# Patient Record
Sex: Male | Born: 1970 | Race: Black or African American | Hispanic: No | Marital: Married | Smoking: Current some day smoker
Health system: Southern US, Community
[De-identification: ages and names within clinical notes are randomized; demographics above are authoritative.]

## PROBLEM LIST (undated history)

## (undated) DIAGNOSIS — M1990 Unspecified osteoarthritis, unspecified site: Secondary | ICD-10-CM

## (undated) HISTORY — DX: Unspecified osteoarthritis, unspecified site: M19.90

---

## 2013-03-25 ENCOUNTER — Emergency Department (HOSPITAL_COMMUNITY): Payer: Self-pay

## 2013-03-25 ENCOUNTER — Encounter (HOSPITAL_COMMUNITY): Payer: Self-pay | Admitting: Emergency Medicine

## 2013-03-25 ENCOUNTER — Emergency Department (HOSPITAL_COMMUNITY)
Admission: EM | Admit: 2013-03-25 | Discharge: 2013-03-25 | Disposition: A | Discharge status: Home / Self Care | Payer: Self-pay | Attending: Emergency Medicine | Admitting: Emergency Medicine

## 2013-03-25 DIAGNOSIS — R5381 Other malaise: Secondary | ICD-10-CM | POA: Insufficient documentation

## 2013-03-25 DIAGNOSIS — M5412 Radiculopathy, cervical region: Secondary | ICD-10-CM | POA: Insufficient documentation

## 2013-03-25 DIAGNOSIS — G8929 Other chronic pain: Secondary | ICD-10-CM | POA: Insufficient documentation

## 2013-03-25 DIAGNOSIS — R209 Unspecified disturbances of skin sensation: Secondary | ICD-10-CM | POA: Insufficient documentation

## 2013-03-25 DIAGNOSIS — F17200 Nicotine dependence, unspecified, uncomplicated: Secondary | ICD-10-CM | POA: Insufficient documentation

## 2013-03-25 DIAGNOSIS — R5383 Other fatigue: Secondary | ICD-10-CM

## 2013-03-25 MED ORDER — KETOROLAC TROMETHAMINE 60 MG/2ML IM SOLN
60.0000 mg | Freq: Once | INTRAMUSCULAR | Status: AC
  Administered 2013-03-25: 60 mg via INTRAMUSCULAR
  Filled 2013-03-25: qty 2

## 2013-03-25 MED ORDER — IBUPROFEN 600 MG PO TABS: 600.0000 mg | ORAL_TABLET | Freq: Four times a day (QID) | ORAL | Status: DC | PRN

## 2013-03-25 MED ORDER — PREDNISONE 20 MG PO TABS: ORAL_TABLET | ORAL | Status: DC

## 2013-03-25 NOTE — ED Notes (Signed)
Pt states back and neck pain starting x 1 year.  Pain is getting worse and pt states changes in feeling to hands.  Change in hands started Thursday, however numbness has been going off/on for a while.

## 2013-03-25 NOTE — Discharge Instructions (Signed)
Cervical Radiculopathy Cervical radiculopathy happens when a nerve in the neck is pinched or bruised by a slipped (herniated) disk or by arthritic changes in the bones of the cervical spine. This can occur due to an injury or as part of the normal aging process. Pressure on the cervical nerves can cause pain or numbness that runs from your neck all the way down into your arm and fingers. CAUSES  There are many possible causes, including:  Injury.  Muscle tightness in the neck from overuse.  Swollen, painful joints (arthritis).  Breakdown or degeneration in the bones and joints of the spine (spondylosis) due to aging.  Bone spurs that may develop near the cervical nerves. SYMPTOMS  Symptoms include pain, weakness, or numbness in the affected arm and hand. Pain can be severe or irritating. Symptoms may be worse when extending or turning the neck. DIAGNOSIS  Your caregiver will ask about your symptoms and do a physical exam. He or she may test your strength and reflexes. X-rays, CT scans, and MRI scans may be needed in cases of injury or if the symptoms do not go away after a period of time. Electromyography (EMG) or nerve conduction testing may be done to study how your nerves and muscles are working. TREATMENT  Your caregiver may recommend certain exercises to help relieve your symptoms. Cervical radiculopathy can, and often does, get better with time and treatment. If your problems continue, treatment options may include:  Wearing a soft collar for short periods of time.  Physical therapy to strengthen the neck muscles.  Medicines, such as nonsteroidal anti-inflammatory drugs (NSAIDs), oral corticosteroids, or spinal injections.  Surgery. Different types of surgery may be done depending on the cause of your problems. HOME CARE INSTRUCTIONS   Put ice on the affected area.  Put ice in a plastic bag.  Place a towel between your skin and the bag.  Leave the ice on for 15-20 minutes,  03-04 times a day or as directed by your caregiver.  If ice does not help, you can try using heat. Take a warm shower or bath, or use a hot water bottle as directed by your caregiver.  You may try a gentle neck and shoulder massage.  Use a flat pillow when you sleep.  Only take over-the-counter or prescription medicines for pain, discomfort, or fever as directed by your caregiver.  If physical therapy was prescribed, follow your caregiver's directions.  If a soft collar was prescribed, use it as directed. SEEK IMMEDIATE MEDICAL CARE IF:   Your pain gets much worse and cannot be controlled with medicines.  You have weakness or numbness in your hand, arm, face, or leg.  You have a high fever or a stiff, rigid neck.  You lose bowel or bladder control (incontinence).  You have trouble with walking, balance, or speaking. MAKE SURE YOU:   Understand these instructions.  Will watch your condition.  Will get help right away if you are not doing well or get worse. Document Released: 10/13/2000 Document Revised: 04/12/2011 Document Reviewed: 09/01/2010 [facility] Patient Information 2014 [place], [place].   Emergency Department Resource Guide 1) Find a Doctor and Pay Out of Pocket Although you won't have to find out who is covered by your insurance plan, it is a good idea to ask around and get recommendations. You will then need to call the office and see if the doctor you have chosen will accept you as a new patient and what types of options they offer for  patients who are self-pay. Some doctors offer discounts or will set up payment plans for their patients who do not have insurance, but you will need to ask so you aren't surprised when you get to your appointment.  2) Contact Your Local Health Department Not all health departments have doctors that can see patients for sick visits, but many do, so it is worth a call to see if yours does. If you don't know where your local health  department is, you can check in your phone book. The CDC also has a tool to help you locate your state's health department, and many state websites also have listings of all of their local health departments.  3) Find a Walk-in Clinic If your illness is not likely to be very severe or complicated, you may want to try a walk in clinic. These are popping up all over the country in pharmacies, drugstores, and shopping centers. They're usually staffed by nurse practitioners or physician assistants that have been trained to treat common illnesses and complaints. They're usually fairly quick and inexpensive. However, if you have serious medical issues or chronic medical problems, these are probably not your best option.  No Primary Care Doctor: - Call Health Connect at  [phone] - they can help you locate a primary care doctor that  accepts your insurance, provides certain services, etc. - Physician Referral Service- [phone]  Chronic Pain Problems: Organization         Address  Phone   Notes  [facility]  [phone] Patients need to be referred by their primary care doctor.   Medication Assistance: Organization         Address  Phone   Notes  [facility] Medication [facility] [address] [place], [place] [address] [phone] --Must be a resident of [facility] -- Must have NO insurance coverage whatsoever (no Medicaid/ Medicare, etc.) -- The pt. MUST have a primary care doctor that directs their care regularly and follows them in the community   [facility]  [phone]   [employer]  [phone]    Agencies that provide inexpensive medical care: Organization         Address  Phone   Notes  [facility]  [phone]   [facility]    [phone]   [facility] [address] [place], [place] [address] [phone]   [facility] [address] [place], [place] [phone]   Planned Parenthood    [phone]   [facility]    [phone]   [facility] and [facility]  [address], [place] Phone:  [phone], Fax:  [phone] Hours of Operation:  9 am - 6 pm, M-F.  Also accepts Medicaid/Medicare and self-pay.  [facility]  [address], [place] Phone: [phone], Fax: [phone]. Hours of Operation:  8:30 am - 5:30 pm, M-F.  Also accepts Medicaid and self-pay.  [facility] [place] [address], [place] Phone: [phone]   [facility] [address] Natasha Bence [place], [place] [phone], Ext. [phone] Mondays & Thursdays: 7-9 AM.  First 15 patients are seen on a first come, first serve basis.    Medicaid-accepting [facility] Providers:  Organization         Address  Phone   Notes  [facility] [address], [place] [phone] Also accepts self-pay patients.  [facility] [address], [place]  [phone]   [facility] [address], [place] [phone]   [facility] [address] [place] [address], [place] [phone]) [phone]   Renaye RakersVeita Bland [address], [place]   [phone] Only accepts [place] Access [place] patients after they have their name applied to their card.   Self-Pay (no insurance) in [facility] [place]:  Organization         Address  Phone   Notes  Sickle Cell Patients, [facility] [address], [place] [phone]   [facility] [address], [place] [phone]   [facility]  [address], [place] [phone]   [facility]/Dr. Osei-Bonsu  [address] [place] [address], [place] or [phone], [place] [phone] Phone number for both [place] and  [place] locations is the same.  [facility] [address], [place] [phone]   [facility] [place] [address] [place] [address], [place] or [address] [phone]   [facility] [address], [place] [phone], phone; [phone], fax Sees patients 1st and 3rd Saturday of every month.  Must not qualify for public or private insurance (i.e. Medicaid, Medicare, [place] Health Choice, Veterans' Benefits)  Household income should be no more than 200% of the poverty level The clinic cannot treat you if you are pregnant or think you are pregnant  Sexually transmitted diseases are not treated at the clinic.    Dental Care: Organization         Address  Phone  Notes  [facility] [address], [place] [phone] Accepts children up to age 44 who are enrolled in [place] or [place] Health Choice; pregnant women with a Medicaid card; and children who have applied for Medicaid or [place] Health Choice, but were declined, whose parents can pay a reduced fee at time of service.  [facility] [place]  [address], [place] [phone] Accepts children up to age 44 who are enrolled in [place] or [place] Health Choice; pregnant women with a Medicaid card; and children who have applied for Medicaid or  Health Choice, but were declined, whose parents can pay a reduced fee at time of service.  [facility]  [address], [place] [phone] Patients are seen by appointment only. Walk-ins are not accepted. [facility] will see patients 44 years of age and older. Monday - Tuesday (8am-5pm) Most Wednesdays (8:30-5pm) $30 per visit, cash only  [facility]  [address], [facility] [place] [phone] Patients are seen by appointment only. Walk-ins are not accepted.  [facility] will see patients 44 years of age and older. One Wednesday Evening (Monthly: Volunteer Based).  $30 per visit, cash only  [facility] [facility]  [phone] for adults; Children under age 614, call Graduate Pediatric Dentistry at [phone]. Children aged 584-14, please call [phone] to request a pediatric application.  Dental services are provided in all areas of dental care including fillings, crowns and bridges, complete and partial dentures, implants, gum treatment,  root canals, and extractions. Preventive care is also provided. Treatment is provided to both adults and children. Patients are selected via a lottery and there is often a waiting list.   [facility] [address], [place]  [phone] [email or web address]   [facility] [address] [address] [place], [place] [phone], Ext. [phone] Second and Fourth Thursday of each month, opens at 6:30 AM; Clinic ends at 9 AM.  Patients are seen on a first-come first-served basis, and a limited number are seen during each clinic.   [facility]  [address] Ether [place]ins [place], [place] [phone]   Eligibility Requirements You must have lived in [place], [place], or [place] counties for at least the last three months.   You cannot be eligible for state or federal sponsored [place], including [employer], [place], or [employer].   You generally cannot be eligible for healthcare insurance through your employer.    How to apply: Eligibility screenings are held every Tuesday and Wednesday afternoon from 1:00 pm until 4:00 pm. You do not need an appointment for the interview!  [facility] [address] [place], [place] [phone]   [facility] Health Department  [phone]   [facility] Health Department  [phone]   [facility] Health Department  [phone]    Behavioral Health Resources in the  Community: Intensive Outpatient Programs Organization         Address  Phone  Notes  [facility] [address], [place], [place] [phone]   [facility] Outpatient [address] [place], [place] [phone]   [facility] [address] [place], [place]  [phone]   [facility] [address]  [place], [place] [phone] or [phone]   Substance Abuse Resources Organization         Address  Phone  Notes  [facility]  [phone]   [facility]  [phone]   [facility] [place]  [phone]   Floydene Flock  [phone]   Residential & Outpatient Substance Abuse Program  [phone]   Psychological Services Organization         Address  Phone  Notes  [facility] Behavioral Health  [phone]   [facility] Services  [phone]   [facility] [address], [place] [phone] or [phone]    Mobile Crisis Teams Organization         Address  Phone  Notes  [facility], Mobile Crisis Care Unit  [phone]   [facility]  [address] [place], [place] [phone]   Doristine Locks [address] [place] [phone]    Self-Help/Support Groups Organization         Address  Phone             Notes  [facility] [place] - variety of support groups  336- I7437963 Call for more information  Narcotics Anonymous (NA), [facility] [address], [employer] [place]  2 meetings at this location   Statistician         Address  Phone  Notes  [facility] [address],    [place]  [phone]   [facility]  [address], [place] [address], [place], [place] [phone]   [facility] [address] [place], [place] [phone] Admissions: 8am-3pm M-F  [facility]  [address],    [place], [place] [phone]   [facility] [address] [place], [place], [place] [phone]   [facility] [address],  [place], [place] [phone]   [facility] - Intensive Outpatient [address], [place], [place] [phone]   [facility] [address] [place]  [place], [place] [phone] or [phone]   [facility] ([facility]) [address], [place], [place] [phone] Accepts Medicaid  [facility] [place] [address],  [place] [phone] Substance Abuse/Addiction Treatment   [facility] Organization         Address  Phone  Notes  [facility]  [phone]   Angie Fava, PhD [address] Ervin Knack [place], [place]   [phone] or [phone]   [facility]   [address] [place], [place] [phone]   [facility] [address] [place], [place] [phone] Insurance/Medicaid/sponsorship through [facility] Families [address]                                    [place], [place] [phone] Therapy/tele-psych/case  [facility] [address], [place] [phone]    Dr. Lolly Mustache  [phone]   [facility]  [employer] [facility] Dept. 1) [address] [place] 2) [address] [place] 3)  [address], [place] [phone]  [phone]   [facility] Child Abuse Hotline [phone] or [phone] (After Hours)

## 2013-03-25 NOTE — ED Provider Notes (Signed)
CSN: [id number]     Arrival date & time 03/25/13  0708 History   First MD Initiated Contact with Patient 03/25/13 [phone]     Chief Complaint  Patient presents with  . Neck Pain     (Consider location/radiation/quality/duration/timing/severity/associated sxs/prior Treatment) Patient is a 43 y.o. male presenting with neck pain. The history is provided by the patient. No language interpreter was used.  Neck Pain Pain location:  L side (just left of midline, central c-spine) Quality:  Shooting and stabbing Radiates to: down back. Pain severity:  Moderate Onset quality:  Sudden Timing:  Intermittent Progression:  Waxing and waning Chronicity:  Chronic (1 year) Worsened by:  Nothing tried Ineffective treatments:  None tried Associated symptoms: numbness, tingling and weakness   Associated symptoms: no bladder incontinence, no bowel incontinence, no chest pain, no fever, no headaches, no photophobia, no visual change and no weight loss   Weakness:    Severity:  Mild   Duration: months, L hand.   Onset quality:  Unable to specify   Progression:  Unchanged   Timing:  Constant   History reviewed. No pertinent past medical history. History reviewed. No pertinent past surgical history. History reviewed. No pertinent family history. History  Substance Use Topics  . Smoking status: Current Some Day Smoker    Types: Cigars  . Smokeless tobacco: Not on file  . Alcohol Use: Yes     Comment: social    Review of Systems  Constitutional: Negative for fever, weight loss, activity change, appetite change and fatigue.  HENT: Negative for congestion, facial swelling, rhinorrhea and trouble swallowing.   Eyes: Negative for photophobia and pain.  Respiratory: Negative for cough, chest tightness and shortness of breath.   Cardiovascular: Negative for chest pain and leg swelling.  Gastrointestinal: Negative for nausea, vomiting, abdominal pain, diarrhea, constipation and bowel incontinence.    Endocrine: Negative for polydipsia and polyuria.  Genitourinary: Negative for bladder incontinence, dysuria, urgency, decreased urine volume and difficulty urinating.  Musculoskeletal: Positive for neck pain. Negative for back pain and gait problem.  Skin: Negative for color change, rash and wound.  Allergic/Immunologic: Negative for immunocompromised state.  Neurological: Positive for tingling, weakness and numbness. Negative for dizziness, facial asymmetry, speech difficulty and headaches.  Psychiatric/Behavioral: Negative for confusion, decreased concentration and agitation.      Allergies  Review of patient's allergies indicates no known allergies.  Home Medications   Current Outpatient Rx  Name  Route  Sig  Dispense  Refill  . ibuprofen (ADVIL,MOTRIN) 600 MG tablet   Oral   Take 1 tablet (600 mg total) by mouth every 6 (six) hours as needed.   30 tablet   0   . predniSONE (DELTASONE) 20 MG tablet      3 tabs po day one, then 2 tabs daily x 4 days   11 tablet   0    BP 141/111  Pulse 81  Temp(Src) 98.4 F (36.9 C) (Oral)  Resp 14  SpO2 98% Physical Exam  Constitutional: He is oriented to person, place, and time. He appears well-developed and well-nourished. No distress.  HENT:  Head: Normocephalic and atraumatic.  Mouth/Throat: No oropharyngeal exudate.  Eyes: Pupils are equal, round, and reactive to light.  Neck: Normal range of motion. Neck supple. Spinous process tenderness present.    Cardiovascular: Normal rate, regular rhythm and normal heart sounds.  Exam reveals no gallop and no friction rub.   No murmur heard. Pulmonary/Chest: Effort normal and breath sounds normal. No  respiratory distress. He has no wheezes. He has no rales.  Abdominal: Soft. Bowel sounds are normal. He exhibits no distension and no mass. There is no tenderness. There is no rebound and no guarding.  Musculoskeletal: Normal range of motion. He exhibits no edema and no tenderness.        Right hand: He exhibits normal range of motion and no tenderness. Decreased sensation noted.       Left hand: He exhibits normal range of motion, no tenderness and normal capillary refill. Decreased sensation noted.       Hands: He reports paresthesias in circled fingers   Neurological: He is alert and oriented to person, place, and time.  Skin: Skin is warm and dry.  Psychiatric: He has a normal mood and affect.    ED Course  Procedures (including critical care time) Labs Review Labs Reviewed - No data to display Imaging Review Ct Cervical Spine Wo Contrast  03/25/2013   CLINICAL DATA:  Worsening neck pain  EXAM: CT CERVICAL SPINE WITHOUT CONTRAST  TECHNIQUE: Multidetector CT imaging of the cervical spine was performed without intravenous contrast. Multiplanar CT image reconstructions were also generated.  COMPARISON:  None.  FINDINGS: Normal cervical spine alignment. Preserved vertebral body heights. Negative for fracture, compression deformity or focal kyphosis. Facets aligned. Degenerative disc disease and spondylosis noted at C3-4, C4-5, and C5-6. Disc osteophyte complex appears to creates some degree of spinal stenosis at C4-5. This is not well delineated by noncontrast CT. This would be better evaluated with nonemergent cervical spine MRI, given the patient's symptoms. Normal prevertebral soft tissues. Intact odontoid. Clear lung apices.  IMPRESSION: No acute cervical spine fracture or osseous abnormality.  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   Electronically Signed   By: Ruel Favorsrevor  Shick M.D.   On: 03/25/2013 08:28    EKG Interpretation   None       MDM   Final diagnoses:  Cervical radicular pain    Pt is a 43 y.o. male with Pmhx as above who presents with about 1 year of sharp mid cervical pain just left of posterior midline with radiation down back. He has had several months of intermittent paresthesias in fingertips BL, that have now been constant for 3  days, worse L hand.  He reports several months of L hand weakness. Denies fever, back pain, bowel/bladder incontinence, LE symptoms. On PE, VSS in pt NAD. +ttp neck as above.  No appreciable muscle weakness on PE. He has subjective dec fine touch as shown above.   CT shows no acute fx or osseous abnormality, but does have DDD and spondylosis with some degree of spinal stenosis at C4-C5.  Given symptoms are more L sided, suspect cervical radiculopathy.  Will put on short steroid burst, rec scheduled NSAIDs and outpt f/u with neurosurgery. Have also recommended he est with a PCP. Return precautions given for new or worsening symptoms including worsening pain, numbness, weakness.          Shanna CiscoMegan E Docherty, MD 03/25/13 [phone]

## 2013-03-31 ENCOUNTER — Ambulatory Visit: Payer: Self-pay

## 2013-04-06 ENCOUNTER — Ambulatory Visit: Payer: Self-pay | Attending: Internal Medicine | Admitting: Internal Medicine

## 2013-04-06 ENCOUNTER — Encounter: Payer: Self-pay | Admitting: Internal Medicine

## 2013-04-06 VITALS — BP 153/94 | HR 76 | Temp 98.8°F | Resp 14 | Ht 76.0 in | Wt 217.2 lb

## 2013-04-06 DIAGNOSIS — I10 Essential (primary) hypertension: Secondary | ICD-10-CM

## 2013-04-06 DIAGNOSIS — R209 Unspecified disturbances of skin sensation: Secondary | ICD-10-CM | POA: Insufficient documentation

## 2013-04-06 DIAGNOSIS — M542 Cervicalgia: Secondary | ICD-10-CM | POA: Insufficient documentation

## 2013-04-06 DIAGNOSIS — F17200 Nicotine dependence, unspecified, uncomplicated: Secondary | ICD-10-CM | POA: Insufficient documentation

## 2013-04-06 LAB — COMPLETE METABOLIC PANEL WITH GFR
ALBUMIN: 4.7 g/dL (ref 3.5–5.2)
ALT: 25 U/L (ref 0–53)
AST: 22 U/L (ref 0–37)
Alkaline Phosphatase: 60 U/L (ref 39–117)
BUN: 14 mg/dL (ref 6–23)
CO2: 28 meq/L (ref 19–32)
Calcium: 9.9 mg/dL (ref 8.4–10.5)
Chloride: 103 mEq/L (ref 96–112)
Creat: 1.04 mg/dL (ref 0.50–1.35)
GFR, Est Non African American: 88 mL/min
GLUCOSE: 82 mg/dL (ref 70–99)
POTASSIUM: 4.5 meq/L (ref 3.5–5.3)
SODIUM: 139 meq/L (ref 135–145)
TOTAL PROTEIN: 7.4 g/dL (ref 6.0–8.3)
Total Bilirubin: 0.7 mg/dL (ref 0.2–1.2)

## 2013-04-06 LAB — CBC WITH DIFFERENTIAL/PLATELET
BASOS ABS: 0.1 10*3/uL (ref 0.0–0.1)
BASOS PCT: 1 % (ref 0–1)
Eosinophils Absolute: 0.3 10*3/uL (ref 0.0–0.7)
Eosinophils Relative: 4 % (ref 0–5)
HCT: 44 % (ref 39.0–52.0)
Hemoglobin: 15.2 g/dL (ref 13.0–17.0)
LYMPHS ABS: 2.5 10*3/uL (ref 0.7–4.0)
Lymphocytes Relative: 34 % (ref 12–46)
MCH: 29.3 pg (ref 26.0–34.0)
MCHC: 34.5 g/dL (ref 30.0–36.0)
MCV: 84.8 fL (ref 78.0–100.0)
Monocytes Absolute: 0.6 10*3/uL (ref 0.1–1.0)
Monocytes Relative: 8 % (ref 3–12)
NEUTROS PCT: 53 % (ref 43–77)
Neutro Abs: 3.9 10*3/uL (ref 1.7–7.7)
Platelets: 192 10*3/uL (ref 150–400)
RBC: 5.19 MIL/uL (ref 4.22–5.81)
RDW: 13.9 % (ref 11.5–15.5)
WBC: 7.4 10*3/uL (ref 4.0–10.5)

## 2013-04-06 LAB — LIPID PANEL
Cholesterol: 195 mg/dL (ref 0–200)
HDL: 58 mg/dL (ref >39)
LDL CALC: 123 mg/dL — AB (ref 0–99)
Total CHOL/HDL Ratio: 3.4 Ratio
Triglycerides: 71 mg/dL (ref <150)
VLDL: 14 mg/dL (ref 0–40)

## 2013-04-06 MED ORDER — CYCLOBENZAPRINE HCL 10 MG PO TABS: 10.0000 mg | ORAL_TABLET | Freq: Every day | ORAL | Status: DC

## 2013-04-06 MED ORDER — TRAMADOL HCL 50 MG PO TABS: 50.0000 mg | ORAL_TABLET | Freq: Three times a day (TID) | ORAL | Status: DC | PRN

## 2013-04-06 MED ORDER — GABAPENTIN 100 MG PO CAPS: 200.0000 mg | ORAL_CAPSULE | Freq: Three times a day (TID) | ORAL | Status: DC

## 2013-04-06 MED ORDER — CLONIDINE HCL 0.1 MG PO TABS
0.1000 mg | ORAL_TABLET | Freq: Once | ORAL | Status: AC
  Administered 2013-04-06: 0.1 mg via ORAL

## 2013-04-06 NOTE — Progress Notes (Signed)
Patient is here to establish care. Patient complains of pain in the back of neck x1 year and numbness in bilateral fingers off and on x4 months. Numbness has stayed x2 weeks. No feeling in 3 fingers on Rt and 2 fingers on Lt hand. Also complains of constant headaches. Patient will need to speak with Tywan.

## 2013-04-06 NOTE — Progress Notes (Signed)
Patient ID: Dylan Banks, male   DOB: 11/08/1970, 43 y.o.   MRN: [id number]   CC:  HPI: 43 year old male here to establish care.  He complains of sharp shooting pain below the base of his neck, to the left of the C-spine, especially when he bends his neck towards the left shoulder. He also complains of numbness and tingling in both hands. This completed a course of prednisone. CT without contrast showed no cervical spine fracture with some degenerative disc changes and spondylosis. No other complaints  Social history Nonsmoker nonalcoholic  Family history reviewed and found to be negative    No Known Allergies Past Medical History  Diagnosis Date  . Arthritis    Current Outpatient Prescriptions on File Prior to Visit  Medication Sig Dispense Refill  . predniSONE (DELTASONE) 20 MG tablet 3 tabs po day one, then 2 tabs daily x 4 days  11 tablet  0  . ibuprofen (ADVIL,MOTRIN) 600 MG tablet Take 1 tablet (600 mg total) by mouth every 6 (six) hours as needed.  30 tablet  0   No current facility-administered medications on file prior to visit.   History reviewed. No pertinent family history. History   Social History  . Marital Status: Married    Spouse Name: N/A    Number of Children: N/A  . Years of Education: N/A   Occupational History  . Not on file.   Social History Main Topics  . Smoking status: Current Some Day Smoker    Types: Cigars  . Smokeless tobacco: Not on file  . Alcohol Use: Yes     Comment: social  . Drug Use: No  . Sexual Activity: Not on file   Other Topics Concern  . Not on file   Social History Narrative  . No narrative on file    Review of Systems  Constitutional: Negative for fever, chills, diaphoresis, activity change, appetite change and fatigue.  HENT: Negative for ear pain, nosebleeds, congestion, facial swelling, rhinorrhea, neck pain, neck stiffness and ear discharge.   Eyes: Negative for pain, discharge, redness, itching and visual  disturbance.  Respiratory: Negative for cough, choking, chest tightness, shortness of breath, wheezing and stridor.   Cardiovascular: Negative for chest pain, palpitations and leg swelling.  Gastrointestinal: Negative for abdominal distention.  Genitourinary: Negative for dysuria, urgency, frequency, hematuria, flank pain, decreased urine volume, difficulty urinating and dyspareunia.  Musculoskeletal: Positive for neck pain. Negative for back pain and gait problem.  Skin: Negative for color change, rash and wound.  Allergic/Immunologic: Negative for immunocompromised state.  Neurological: Positive for tingling, weakness and numbness. Negative for dizziness, facial asymmetry, speech difficulty and headaches.  Hematological: Negative for adenopathy. Does not bruise/bleed easily.  Psychiatric/Behavioral: Negative for hallucinations, behavioral problems, confusion, dysphoric mood, decreased concentration and agitation.    Objective:   Filed Vitals:   04/06/13 1126  BP: 153/94  Pulse: 76  Temp: 98.8 F (37.1 C)  Resp: 14    Physical Exam  Constitutional: Appears well-developed and well-nourished. No distress.  HENT: Normocephalic. External right and left ear normal. Oropharynx is clear and moist.  Eyes: Conjunctivae and EOM are normal. PERRLA, no scleral icterus.  Neck: Normal ROM. Neck supple. No JVD. No tracheal deviation. No thyromegaly.  CVS: RRR, S1/S2 +, no murmurs, no gallops, no carotid bruit.  Pulmonary: Effort and breath sounds normal, no stridor, rhonchi, wheezes, rales.  Abdominal: Soft. BS +,  no distension, tenderness, rebound or guarding.  Musculoskeletal: Normal range of motion. No edema and  no tenderness.  Lymphadenopathy: No lymphadenopathy noted, cervical, inguinal. Neuro: Alert. Normal reflexes, muscle tone coordination. No cranial nerve deficit. Skin: Skin is warm and dry. No rash noted. Not diaphoretic. No erythema. No pallor.  Psychiatric: Normal mood and affect.  Behavior, judgment, thought content normal.   No results found for this basename: WBC, HGB, HCT, MCV, PLT   No results found for this basename: CREATININE, BUN, NA, K, CL, CO2    No results found for this basename: HGBA1C   Lipid Panel  No results found for this basename: chol, trig, hdl, cholhdl, vldl, ldlcalc       Assessment and plan:   There are no active problems to display for this patient.  C-spine pain We'll order MRI of the C-spine to further define any cord compression, disc prolapse Ordered the patient to have double pending for numbness and tingling, Flexeril and tramadol for pain, recommend patient not to drive or mix these medications with alcohol  Sports medicine referral If MRI abnormal refer to neurosurgery  Establish care Obtain baseline labs Followup in 2 months The patient was given clear instructions to go to ER or return to medical center if symptoms don't improve, worsen or new problems develop. The patient verbalized understanding. The patient was told to call to get any lab results if not heard anything in the next week.

## 2013-04-07 LAB — TSH: TSH: 0.26 u[IU]/mL — AB (ref 0.350–4.500)

## 2013-04-10 ENCOUNTER — Telehealth: Payer: Self-pay | Admitting: Emergency Medicine

## 2013-04-10 NOTE — Telephone Encounter (Signed)
Message copied by Darlis LoanSMITH, Salvador Coupe D on Tue Apr 10, 2013  5:32 PM ------      Message from: Susie CassetteABROL MD, [facility]      Created: Tue Apr 10, 2013  9:49 AM       Please notify the patient that TSH is suppressed, in order to rule out overactive thyroid the patient will need some labs, future labs ordered ------

## 2013-04-10 NOTE — Telephone Encounter (Signed)
2nd attempt to reach pt.  No answer. 

## 2013-04-10 NOTE — Telephone Encounter (Signed)
Message copied by Darlis LoanSMITH, Esmirna Ravan D on Tue Apr 10, 2013  9:55 AM ------      Message from: Susie CassetteABROL MD, [facility]      Created: Tue Apr 10, 2013  9:49 AM       Please notify the patient that TSH is suppressed, in order to rule out overactive thyroid the patient will need some labs, future labs ordered ------

## 2013-04-10 NOTE — Telephone Encounter (Signed)
Left vm for pt to return call when message received. Need to schedule lab visit for T3-T4

## 2013-04-10 NOTE — Addendum Note (Signed)
Addended by: Susie CassetteABROL MD, Germain OsgoodNAYANA on: 04/10/2013 09:48 AM   Modules accepted: Orders

## 2013-04-11 ENCOUNTER — Telehealth: Payer: Self-pay | Admitting: Internal Medicine

## 2013-04-11 NOTE — Telephone Encounter (Signed)
Pt LVM saying he was returning nurse call

## 2013-04-11 NOTE — Telephone Encounter (Signed)
Pt given lab results with lab instructions. Pt scheduled for T3-T4 next Tuesday

## 2013-04-13 ENCOUNTER — Ambulatory Visit: Payer: Self-pay | Attending: Internal Medicine

## 2013-04-13 DIAGNOSIS — I10 Essential (primary) hypertension: Secondary | ICD-10-CM

## 2013-04-14 LAB — T3, FREE: T3, Free: 3.4 pg/mL (ref 2.3–4.2)

## 2013-04-14 LAB — T4, FREE: Free T4: 1.26 ng/dL (ref 0.80–1.80)

## 2013-04-17 ENCOUNTER — Other Ambulatory Visit: Payer: Self-pay

## 2013-04-18 ENCOUNTER — Telehealth: Payer: Self-pay | Admitting: Emergency Medicine

## 2013-04-18 NOTE — Telephone Encounter (Signed)
Left message for pt to call clinic

## 2013-04-18 NOTE — Telephone Encounter (Signed)
Message copied by Darlis LoanSMITH, JILL D on Wed Apr 18, 2013  5:58 PM ------      Message from: Susie CassetteABROL MD, Germain OsgoodNAYANA      Created: Wed Apr 18, 2013 11:03 AM       Notify patient that thyroid function is normal ------

## 2013-04-19 ENCOUNTER — Ambulatory Visit (HOSPITAL_COMMUNITY)
Admission: RE | Admit: 2013-04-19 | Discharge: 2013-04-19 | Disposition: A | Discharge status: Home / Self Care | Payer: Self-pay | Source: Ambulatory Visit | Attending: Radiology | Admitting: Radiology

## 2013-04-19 ENCOUNTER — Ambulatory Visit (HOSPITAL_COMMUNITY)
Admission: RE | Admit: 2013-04-19 | Discharge: 2013-04-19 | Disposition: A | Discharge status: Home / Self Care | Payer: Self-pay | Source: Ambulatory Visit | Attending: Internal Medicine | Admitting: Internal Medicine

## 2013-04-19 ENCOUNTER — Telehealth: Payer: Self-pay | Admitting: Emergency Medicine

## 2013-04-19 ENCOUNTER — Other Ambulatory Visit: Payer: Self-pay | Admitting: Radiology

## 2013-04-19 DIAGNOSIS — Z9889 Other specified postprocedural states: Secondary | ICD-10-CM

## 2013-04-19 DIAGNOSIS — M5030 Other cervical disc degeneration, unspecified cervical region: Secondary | ICD-10-CM | POA: Insufficient documentation

## 2013-04-19 DIAGNOSIS — R209 Unspecified disturbances of skin sensation: Secondary | ICD-10-CM | POA: Insufficient documentation

## 2013-04-19 DIAGNOSIS — M542 Cervicalgia: Secondary | ICD-10-CM | POA: Insufficient documentation

## 2013-04-19 DIAGNOSIS — M4802 Spinal stenosis, cervical region: Secondary | ICD-10-CM

## 2013-04-19 DIAGNOSIS — I10 Essential (primary) hypertension: Secondary | ICD-10-CM

## 2013-04-19 DIAGNOSIS — Z135 Encounter for screening for eye and ear disorders: Secondary | ICD-10-CM | POA: Insufficient documentation

## 2013-04-19 NOTE — Telephone Encounter (Signed)
Pt given MRI results  Neurosurgery referral placed

## 2013-04-19 NOTE — Telephone Encounter (Signed)
Message copied by Darlis LoanSMITH, JILL D on Thu Apr 19, 2013  4:46 PM ------      Message from: Susie CassetteABROL MD, Germain OsgoodNAYANA      Created: Thu Apr 19, 2013  3:19 PM       Please notify patient of the MRI of the C-spine shows moderate spinal stenosis at C4-C5 level. Patient should be referred to neurosurgery for further evaluation. Please put in this referral. ------

## 2013-11-18 ENCOUNTER — Encounter (HOSPITAL_COMMUNITY): Payer: Self-pay | Admitting: Emergency Medicine

## 2013-11-18 ENCOUNTER — Emergency Department (HOSPITAL_COMMUNITY)
Admission: EM | Admit: 2013-11-18 | Discharge: 2013-11-19 | Disposition: A | Discharge status: Home / Self Care | Payer: Self-pay | Attending: Emergency Medicine | Admitting: Emergency Medicine

## 2013-11-18 DIAGNOSIS — M1990 Unspecified osteoarthritis, unspecified site: Secondary | ICD-10-CM | POA: Insufficient documentation

## 2013-11-18 DIAGNOSIS — N200 Calculus of kidney: Secondary | ICD-10-CM | POA: Insufficient documentation

## 2013-11-18 DIAGNOSIS — Z720 Tobacco use: Secondary | ICD-10-CM | POA: Insufficient documentation

## 2013-11-18 DIAGNOSIS — R112 Nausea with vomiting, unspecified: Secondary | ICD-10-CM | POA: Insufficient documentation

## 2013-11-18 LAB — COMPREHENSIVE METABOLIC PANEL
ALBUMIN: 4.3 g/dL (ref 3.5–5.2)
ALK PHOS: 73 U/L (ref 39–117)
ALT: 18 U/L (ref 0–53)
AST: 22 U/L (ref 0–37)
Anion gap: 14 (ref 5–15)
BILIRUBIN TOTAL: 0.3 mg/dL (ref 0.3–1.2)
BUN: 13 mg/dL (ref 6–23)
CHLORIDE: 106 meq/L (ref 96–112)
CO2: 25 mEq/L (ref 19–32)
Calcium: 9.6 mg/dL (ref 8.4–10.5)
Creatinine, Ser: 1.3 mg/dL (ref 0.50–1.35)
GFR calc Af Amer: 76 mL/min — ABNORMAL LOW (ref >90)
GFR calc non Af Amer: 66 mL/min — ABNORMAL LOW (ref >90)
Glucose, Bld: 116 mg/dL — ABNORMAL HIGH (ref 70–99)
Potassium: 3.9 mEq/L (ref 3.7–5.3)
SODIUM: 145 meq/L (ref 137–147)
Total Protein: 7.9 g/dL (ref 6.0–8.3)

## 2013-11-18 LAB — CBC WITH DIFFERENTIAL/PLATELET
Basophils Absolute: 0.1 10*3/uL (ref 0.0–0.1)
Basophils Relative: 0 % (ref 0–1)
EOS PCT: 1 % (ref 0–5)
Eosinophils Absolute: 0.1 10*3/uL (ref 0.0–0.7)
HCT: 44.9 % (ref 39.0–52.0)
Hemoglobin: 14.9 g/dL (ref 13.0–17.0)
LYMPHS ABS: 1.9 10*3/uL (ref 0.7–4.0)
LYMPHS PCT: 12 % (ref 12–46)
MCH: 28.8 pg (ref 26.0–34.0)
MCHC: 33.2 g/dL (ref 30.0–36.0)
MCV: 86.8 fL (ref 78.0–100.0)
MONO ABS: 1.2 10*3/uL — AB (ref 0.1–1.0)
Monocytes Relative: 8 % (ref 3–12)
Neutro Abs: 12.5 10*3/uL — ABNORMAL HIGH (ref 1.7–7.7)
Neutrophils Relative %: 79 % — ABNORMAL HIGH (ref 43–77)
Platelets: 178 10*3/uL (ref 150–400)
RBC: 5.17 MIL/uL (ref 4.22–5.81)
RDW: 12.8 % (ref 11.5–15.5)
WBC: 15.8 10*3/uL — ABNORMAL HIGH (ref 4.0–10.5)

## 2013-11-18 LAB — I-STAT TROPONIN, ED: Troponin i, poc: 0 ng/mL (ref 0.00–0.08)

## 2013-11-18 LAB — LIPASE, BLOOD: Lipase: 17 U/L (ref 11–59)

## 2013-11-18 MED ORDER — ONDANSETRON HCL 4 MG/2ML IJ SOLN
4.0000 mg | Freq: Once | INTRAMUSCULAR | Status: AC
  Administered 2013-11-18: 4 mg via INTRAVENOUS
  Filled 2013-11-18: qty 2

## 2013-11-18 MED ORDER — SODIUM CHLORIDE 0.9 % IV SOLN
Freq: Once | INTRAVENOUS | Status: AC
  Administered 2013-11-18: 23:00:00 via INTRAVENOUS

## 2013-11-18 MED ORDER — FENTANYL CITRATE 0.05 MG/ML IJ SOLN
50.0000 ug | Freq: Once | INTRAMUSCULAR | Status: DC
  Filled 2013-11-18: qty 2

## 2013-11-18 MED ORDER — HYDROMORPHONE HCL 1 MG/ML IJ SOLN
1.0000 mg | Freq: Once | INTRAMUSCULAR | Status: AC
  Administered 2013-11-18: 1 mg via INTRAVENOUS
  Filled 2013-11-18: qty 1

## 2013-11-18 NOTE — ED Notes (Signed)
Pt is unable to urinate at this time. 

## 2013-11-18 NOTE — ED Notes (Signed)
Pt presents via EMS from with c/o of abdominal pain 10/10, initial pt contact inspection reveals shivering, guarding and pt maintaining a fetal position. Pt hyperventilating and in obvious pain induced distressed. States pain had a sudden onset, pointing at RLQ as the focal point of pain origin. Denies GI related hx, denies alcohol problem hx,

## 2013-11-18 NOTE — ED Notes (Signed)
Bed: [id number] Expected date:  Expected time:  Means of arrival:  Comments: EMS M RLQ abd pain

## 2013-11-19 ENCOUNTER — Emergency Department (HOSPITAL_COMMUNITY): Payer: Self-pay

## 2013-11-19 ENCOUNTER — Encounter (HOSPITAL_COMMUNITY): Payer: Self-pay

## 2013-11-19 LAB — URINALYSIS, ROUTINE W REFLEX MICROSCOPIC
BILIRUBIN URINE: NEGATIVE
GLUCOSE, UA: NEGATIVE mg/dL
KETONES UR: NEGATIVE mg/dL
Leukocytes, UA: NEGATIVE
Nitrite: NEGATIVE
PROTEIN: NEGATIVE mg/dL
Specific Gravity, Urine: 1.034 — ABNORMAL HIGH (ref 1.005–1.030)
Urobilinogen, UA: 0.2 mg/dL (ref 0.0–1.0)
pH: 7.5 (ref 5.0–8.0)

## 2013-11-19 LAB — URINE MICROSCOPIC-ADD ON

## 2013-11-19 MED ORDER — OXYCODONE-ACETAMINOPHEN 5-325 MG PO TABS: ORAL_TABLET | ORAL | Status: AC

## 2013-11-19 MED ORDER — KETOROLAC TROMETHAMINE 15 MG/ML IJ SOLN
15.0000 mg | Freq: Once | INTRAMUSCULAR | Status: AC
  Administered 2013-11-19: 15 mg via INTRAVENOUS
  Filled 2013-11-19: qty 1

## 2013-11-19 MED ORDER — IOHEXOL 300 MG/ML  SOLN
50.0000 mL | Freq: Once | INTRAMUSCULAR | Status: AC | PRN
  Administered 2013-11-19: 50 mL via ORAL

## 2013-11-19 MED ORDER — PROMETHAZINE HCL 25 MG PO TABS: 25.0000 mg | ORAL_TABLET | Freq: Four times a day (QID) | ORAL | Status: AC | PRN

## 2013-11-19 MED ORDER — HYDROMORPHONE HCL 1 MG/ML IJ SOLN
0.5000 mg | Freq: Once | INTRAMUSCULAR | Status: AC
  Administered 2013-11-19: 0.5 mg via INTRAVENOUS
  Filled 2013-11-19: qty 1

## 2013-11-19 MED ORDER — IOHEXOL 300 MG/ML  SOLN
100.0000 mL | Freq: Once | INTRAMUSCULAR | Status: AC | PRN
  Administered 2013-11-19: 100 mL via INTRAVENOUS

## 2013-11-19 NOTE — ED Notes (Signed)
Pt was able to tolerate contrast media earlier in the shift and is given a cup of ginger ale at this time as well.

## 2013-11-19 NOTE — ED Provider Notes (Signed)
History of Present Illness   Patient Identification Dylan Banks is a 43 y.o. male.  Patient information was obtained from patient and spouse/SO. History/Exam limitations: none. Patient presented to the Emergency Department by ambulance where the patient received see Ambulance Run Sheet prior to arrival.  Chief Complaint  Abdominal Pain and Emesis   Patient presents for evaluation of abdominal pain. Onset of symptoms was abrupt starting 2 hours ago with rapidly worsening course since that time. The pain is located RLQ without radiation. The pain is rated as severe. The pain is made worse by physical activity, position and movement and is relieved by nothing. The patient also complains of nausea and vomiting. The patient denies diarrhea, fever and headache. No previous history of the same and no known contacts with similar sxs. The patient denies GERD, ETOH.  Patient came in with sudden onset of severe RLQ pain, nausea and vomiting. No previous similar sxs. No history of surgeries.  Past Medical History  Diagnosis Date  . Arthritis    History reviewed. No pertinent family history.[phone]([id number]) No current facility-administered medications for this encounter.   No current outpatient prescriptions on file.   No Known Allergies History   Social History  . Marital Status: Married    Spouse Name: N/A    Number of Children: N/A  . Years of Education: N/A   Occupational History  . Not on file.   Social History Main Topics  . Smoking status: Current Some Day Smoker    Types: Cigars  . Smokeless tobacco: Not on file  . Alcohol Use: Yes     Comment: social  . Drug Use: No  . Sexual Activity: Not on file   Other Topics Concern  . Not on file   Social History Narrative  . No narrative on file   Review of Systems A comprehensive review of systems was negative except for: Gastrointestinal: positive for abdominal pain, nausea and vomiting   Physical Exam   BP 152/97  Pulse 78   Temp(Src) 98 F (36.7 C) (Oral)  Resp 22  SpO2 100% Physical Exam  Nursing note and vitals reviewed. Constitutional: He appears well-developed and well-nourished. No distress.  HENT:  Head: Normocephalic and atraumatic.  Eyes: Conjunctivae normal are normal. No scleral icterus.  Neck: Normal range of motion. Neck supple.  Cardiovascular: Normal rate, regular rhythm and normal heart sounds.   Pulmonary/Chest: Effort normal and breath sounds normal. No respiratory distress.  Abdominal: Soft. Exquisite tenderness to palpation the R lower quadrant.  Musculoskeletal: He exhibits no edema.  Neurological: He is alert.  Skin: Skin is warm and dry. He is not diaphoretic.  Psychiatric: His behavior is normal.     ED Course   Results for orders placed during the hospital encounter of 11/18/13  CBC WITH DIFFERENTIAL      Result Value Ref Range   WBC 15.8 (*) 4.0 - 10.5 K/uL   RBC 5.17  4.22 - 5.81 MIL/uL   Hemoglobin 14.9  13.0 - 17.0 g/dL   HCT 40.944.9  81.139.0 - 91.452.0 %   MCV 86.8  78.0 - 100.0 fL   MCH 28.8  26.0 - 34.0 pg   MCHC 33.2  30.0 - 36.0 g/dL   RDW 78.212.8  95.611.5 - 21.315.5 %   Platelets 178  150 - 400 K/uL   Neutrophils Relative % 79 (*) 43 - 77 %   Neutro Abs 12.5 (*) 1.7 - 7.7 K/uL   Lymphocytes Relative 12  12 - 46 %  Lymphs Abs 1.9  0.7 - 4.0 K/uL   Monocytes Relative 8  3 - 12 %   Monocytes Absolute 1.2 (*) 0.1 - 1.0 K/uL   Eosinophils Relative 1  0 - 5 %   Eosinophils Absolute 0.1  0.0 - 0.7 K/uL   Basophils Relative 0  0 - 1 %   Basophils Absolute 0.1  0.0 - 0.1 K/uL  COMPREHENSIVE METABOLIC PANEL      Result Value Ref Range   Sodium 145  137 - 147 mEq/L   Potassium 3.9  3.7 - 5.3 mEq/L   Chloride 106  96 - 112 mEq/L   CO2 25  19 - 32 mEq/L   Glucose, Bld 116 (*) 70 - 99 mg/dL   BUN 13  6 - 23 mg/dL   Creatinine, Ser 4.091.30  0.50 - 1.35 mg/dL   Calcium 9.6  8.4 - 81.110.5 mg/dL   Total Protein 7.9  6.0 - 8.3 g/dL   Albumin 4.3  3.5 - 5.2 g/dL   AST 22  0 - 37 U/L   ALT  18  0 - 53 U/L   Alkaline Phosphatase 73  39 - 117 U/L   Total Bilirubin 0.3  0.3 - 1.2 mg/dL   GFR calc non Af Amer 66 (*) >90 mL/min   GFR calc Af Amer 76 (*) >90 mL/min   Anion gap 14  5 - 15  LIPASE, BLOOD      Result Value Ref Range   Lipase 17  11 - 59 U/L  I-STAT TROPOININ, ED      Result Value Ref Range   Troponin i, poc 0.00  0.00 - 0.08 ng/mL   Comment 3              Records Reviewed: Nursing notes.  Treatments: Antiemetics given. Intravenous 0.9NS bolus of 1000 ml given. Dilaudid intravenous for pain with complete relief. +leukocystosis with left shift. CT is pending Patient given in hand-off to PA Pisciotta   Arthor CaptainAbigail Bellarae Lizer, PA-C 11/21/13 1223

## 2013-11-19 NOTE — ED Provider Notes (Signed)
Medical screening examination/treatment/procedure(s) were performed by non-physician practitioner and as supervising physician I was immediately available for consultation/collaboration.   EKG Interpretation None        Lyanne CoKevin M Mari Battaglia, MD 11/19/13 [phone]

## 2013-11-19 NOTE — Discharge Instructions (Signed)
In addition to the Percocet you can take naproxen (Aleve). Do not drink, drive or operate machinery while taking Percocet.  Please follow with your primary care doctor in the next 2 days for a check-up. They must obtain records for further management.   Do not hesitate to return to the Emergency Department for any new, worsening or concerning symptoms.    Kidney Stones Kidney stones (urolithiasis) are deposits that form inside your kidneys. The intense pain is caused by the stone moving through the urinary tract. When the stone moves, the ureter goes into spasm around the stone. The stone is usually passed in the urine.  CAUSES   A disorder that makes certain neck glands produce too much parathyroid hormone (primary hyperparathyroidism).  A buildup of uric acid crystals, similar to gout in your joints.  Narrowing (stricture) of the ureter.  A kidney obstruction present at birth (congenital obstruction).  Previous surgery on the kidney or ureters.  Numerous kidney infections. SYMPTOMS   Feeling sick to your stomach (nauseous).  Throwing up (vomiting).  Blood in the urine (hematuria).  Pain that usually spreads (radiates) to the groin.  Frequency or urgency of urination. DIAGNOSIS   Taking a history and physical exam.  Blood or urine tests.  CT scan.  Occasionally, an examination of the inside of the urinary bladder (cystoscopy) is performed. TREATMENT   Observation.  Increasing your fluid intake.  Extracorporeal shock wave lithotripsy--This is a noninvasive procedure that uses shock waves to break up kidney stones.  Surgery may be needed if you have severe pain or persistent obstruction. There are various surgical procedures. Most of the procedures are performed with the use of small instruments. Only small incisions are needed to accommodate these instruments, so recovery time is minimized. The size, location, and chemical composition are all important variables  that will determine the proper choice of action for you. Talk to your health care provider to better understand your situation so that you will minimize the risk of injury to yourself and your kidney.  HOME CARE INSTRUCTIONS   Drink enough water and fluids to keep your urine clear or pale yellow. This will help you to pass the stone or stone fragments.  Strain all urine through the provided strainer. Keep all particulate matter and stones for your health care provider to see. The stone causing the pain may be as small as a grain of salt. It is very important to use the strainer each and every time you pass your urine. The collection of your stone will allow your health care provider to analyze it and verify that a stone has actually passed. The stone analysis will often identify what you can do to reduce the incidence of recurrences.  Only take over-the-counter or prescription medicines for pain, discomfort, or fever as directed by your health care provider.  Make a follow-up appointment with your health care provider as directed.  Get follow-up X-rays if required. The absence of pain does not always mean that the stone has passed. It may have only stopped moving. If the urine remains completely obstructed, it can cause loss of kidney function or even complete destruction of the kidney. It is your responsibility to make sure X-rays and follow-ups are completed. Ultrasounds of the kidney can show blockages and the status of the kidney. Ultrasounds are not associated with any radiation and can be performed easily in a matter of minutes. SEEK MEDICAL CARE IF:  You experience pain that is progressive and unresponsive to  any pain medicine you have been prescribed. SEEK IMMEDIATE MEDICAL CARE IF:   Pain cannot be controlled with the prescribed medicine.  You have a fever or shaking chills.  The severity or intensity of pain increases over 18 hours and is not relieved by pain medicine.  You develop a  new onset of abdominal pain.  You feel faint or pass out.  You are unable to urinate. MAKE SURE YOU:   Understand these instructions.  Will watch your condition.  Will get help right away if you are not doing well or get worse. Document Released: 01/18/2005 Document Revised: 09/20/2012 Document Reviewed: 06/21/2012 [facility] Patient Information 2015 [employer], [place]. This information is not intended to replace advice given to you by your health care provider. Make sure you discuss any questions you have with your health care provider.

## 2013-11-19 NOTE — ED Provider Notes (Signed)
PROGRESS NOTE                                                                                                                 This is a sign-out from PA Harris at shift change: Aggie Hackerntwon Hillary is a 43 y.o. male presenting with acute onset of right lower quadrant pain associated with several episodes of emesis. Plan is to followup CT abdomen pelvis with contrast. Please refer to previous note for full HPI, ROS, PMH and PE.   CT shows a 3 mm obstructing stone at the right UVJ, there is forniceal rupture. .Patient is tolerating by mouth, reportssignificant improvement with Toradol, amenable to discharge.   Filed Vitals:   11/18/13 2209 11/18/13 2213 11/19/13 0036  BP:  152/97   Pulse:  78   Temp:  98 F (36.7 C) 97.5 F (36.4 C)  TempSrc:  Oral Oral  Resp:  22   SpO2: 98% 100%     Medications  ondansetron (ZOFRAN) injection 4 mg (4 mg Intravenous Given 11/18/13 2243)  HYDROmorphone (DILAUDID) injection 1 mg (1 mg Intravenous Given 11/18/13 2249)  0.9 %  sodium chloride infusion ( Intravenous Stopped 11/19/13 0144)  iohexol (OMNIPAQUE) 300 MG/ML solution 50 mL (50 mLs Oral Contrast Given 11/19/13 0048)  iohexol (OMNIPAQUE) 300 MG/ML solution 100 mL (100 mLs Intravenous Contrast Given 11/19/13 0132)  HYDROmorphone (DILAUDID) injection 0.5 mg (0.5 mg Intravenous Given 11/19/13 0151)  ketorolac (TORADOL) 15 MG/ML injection 15 mg (15 mg Intravenous Given 11/19/13 0231)    Evaluation does not show pathology that would require ongoing emergent intervention or inpatient treatment. Pt is hemodynamically stable and mentating appropriately. Discussed findings and plan with patient/guardian, who agrees with care plan. All questions answered. Return precautions discussed and outpatient follow up given.   New Prescriptions   OXYCODONE-ACETAMINOPHEN (PERCOCET/ROXICET) 5-325 MG PER TABLET    1 to 2 tabs PO q6hrs  PRN for pain   PROMETHAZINE (PHENERGAN) 25 MG TABLET    Take 1 tablet (25 mg total) by mouth  every 6 (six) hours as needed for nausea or vomiting.          Wynetta Emeryicole Cortne Amara, PA-C 11/19/13 [phone]

## 2013-11-21 NOTE — ED Provider Notes (Signed)
Medical screening examination/treatment/procedure(s) were performed by non-physician practitioner and as supervising physician I was immediately available for consultation/collaboration.   EKG Interpretation None        Lyanne CoKevin M Asna Muldrow, MD 11/21/13 1725

## 2015-09-17 IMAGING — CT CT CERVICAL SPINE W/O CM
2 series · 10 of 14 positions shown, 12 images · non-contrast
Comparison: None.

CLINICAL DATA: Worsening neck pain

EXAM:
CT CERVICAL SPINE WITHOUT CONTRAST
TECHNIQUE: Multidetector CT imaging of the cervical spine was performed without
intravenous contrast. Multiplanar CT image reconstructions were also
generated.

[Series 3: c-spine st · axial · 0.23mm/px · z∈[-212,-90]mm · 5 of 93 slices shown, 7 images]
[im 16/93  soft-tissue]
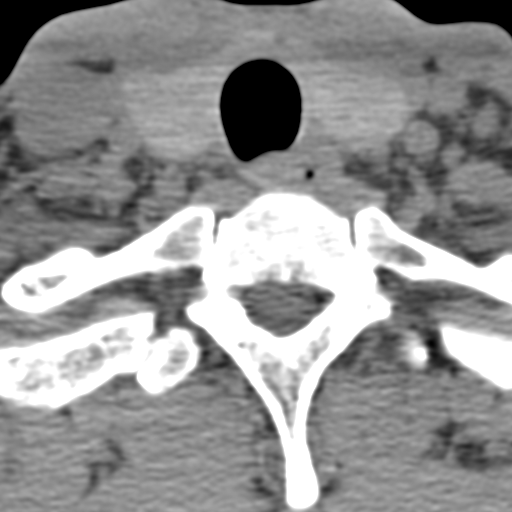
[im 16/93  bone]
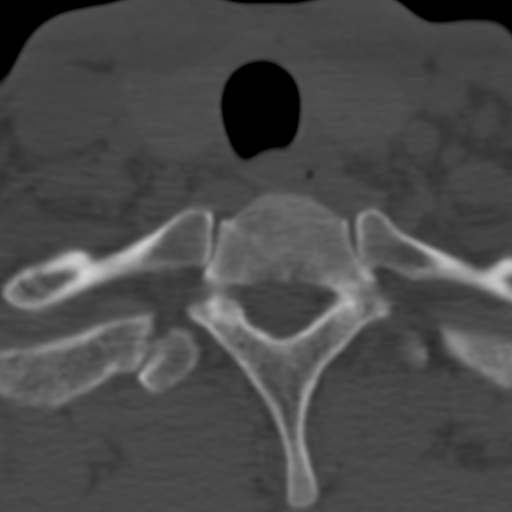
[im 31/93  bone]
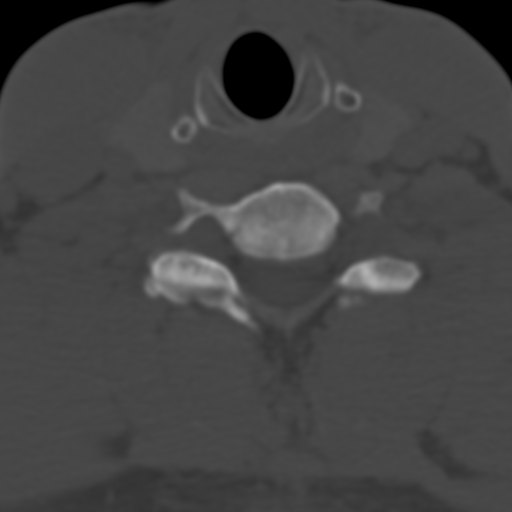
[im 47/93  bone]
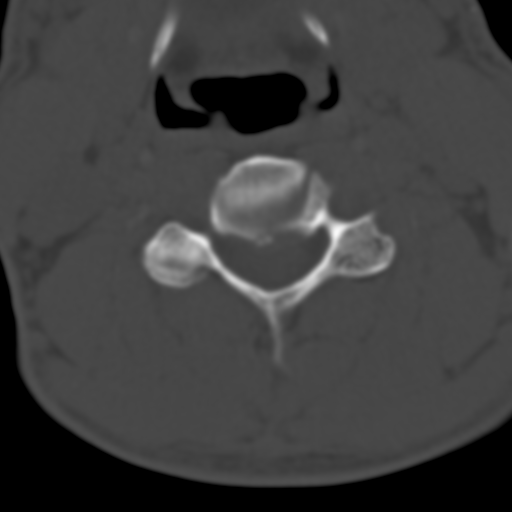
[im 62/93  bone]
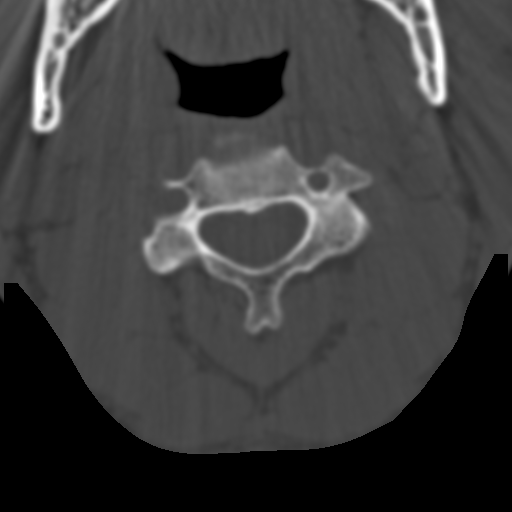
[im 77/93  soft-tissue]
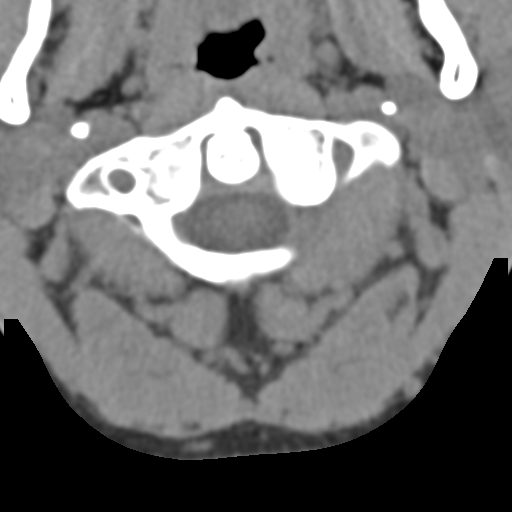
[im 77/93  bone]
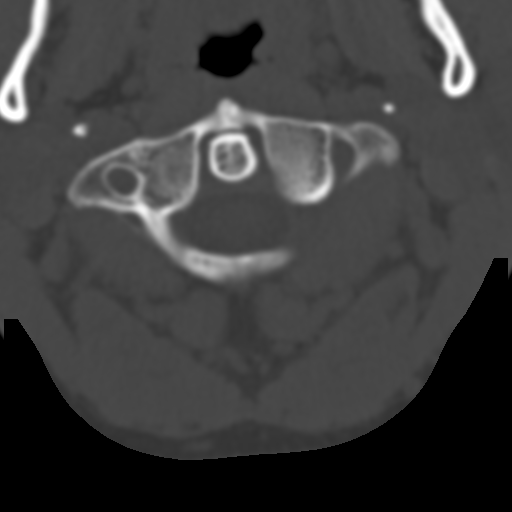

[Series 6: axial reformats · axial · 0.23mm/px · z∈[-233,-120]mm · 5 of 94 slices shown]
[im 16/94  bone]
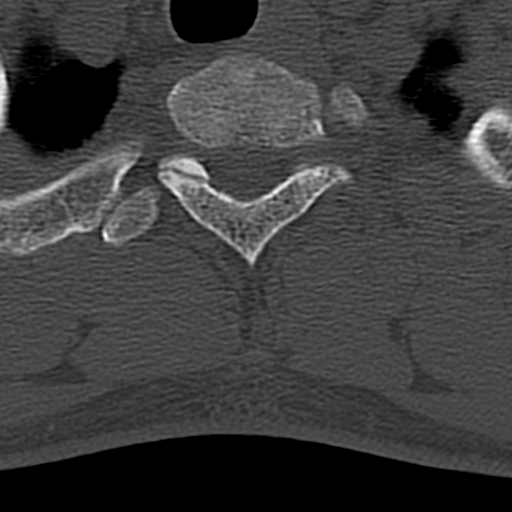
[im 32/94  bone]
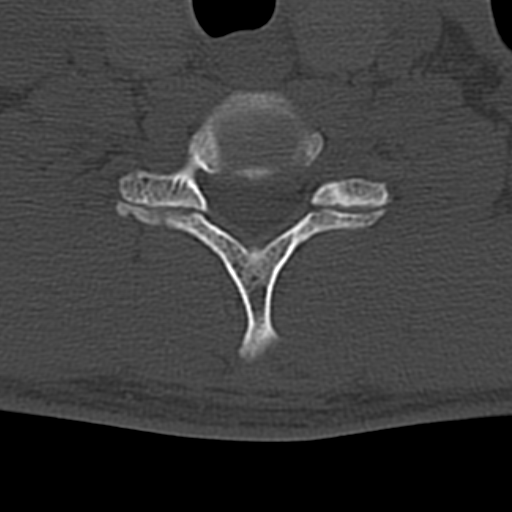
[im 47/94  bone]
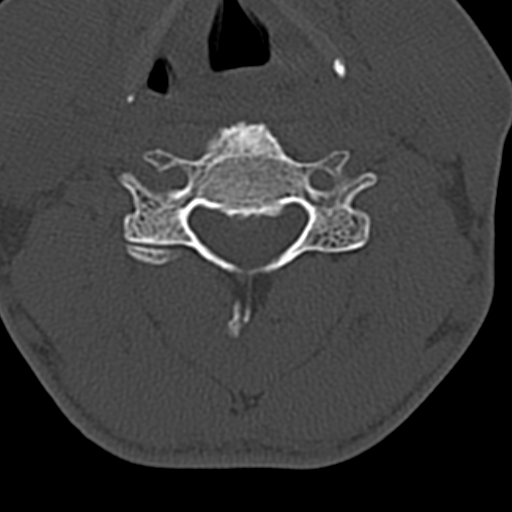
[im 63/94  bone]
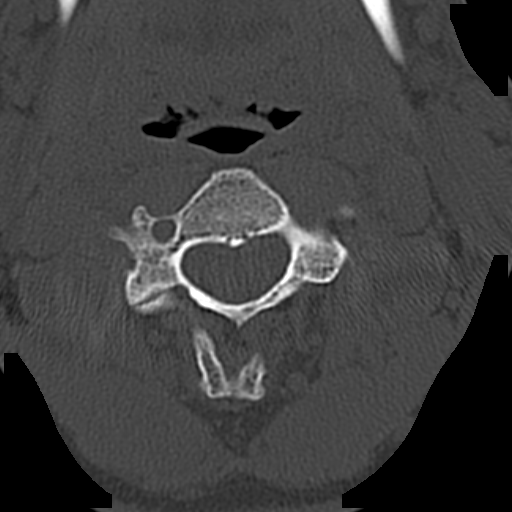
[im 78/94  bone]
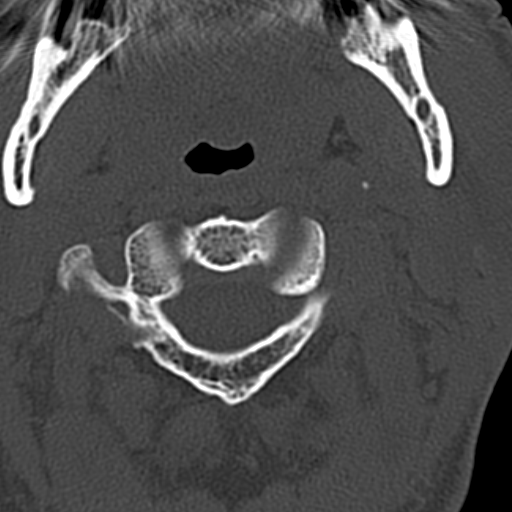

[10 of 14 positions shown; findings below may reference images not displayed]

FINDINGS: Normal cervical spine alignment. Preserved vertebral body heights.
Negative for fracture, compression deformity or focal kyphosis.
Facets aligned. Degenerative disc disease and spondylosis noted at
C3-4, C4-5, and C5-6. Disc osteophyte complex appears to creates
some degree of spinal stenosis at C4-5. This is not well delineated
by noncontrast CT. This would be better evaluated with nonemergent
cervical spine MRI, given the patient's symptoms. Normal
prevertebral soft tissues. Intact odontoid. Clear lung apices.
IMPRESSION: No acute cervical spine fracture or osseous abnormality.

Mid cervical degenerative disc disease and spondylosis.
